# Patient Record
Sex: Female | Born: 1990 | Race: White | Hispanic: No | Smoking: Never smoker
Health system: Southern US, Community
[De-identification: ages and names within clinical notes are randomized; demographics above are authoritative.]

## PROBLEM LIST (undated history)

## (undated) DIAGNOSIS — F419 Anxiety disorder, unspecified: Secondary | ICD-10-CM

---

## 2018-04-13 ENCOUNTER — Emergency Department (HOSPITAL_BASED_OUTPATIENT_CLINIC_OR_DEPARTMENT_OTHER)
Admission: EM | Admit: 2018-04-13 | Discharge: 2018-04-13 | Disposition: A | Discharge status: Home / Self Care | Payer: Self-pay | Attending: Emergency Medicine | Admitting: Emergency Medicine

## 2018-04-13 ENCOUNTER — Emergency Department (HOSPITAL_BASED_OUTPATIENT_CLINIC_OR_DEPARTMENT_OTHER): Payer: Self-pay

## 2018-04-13 ENCOUNTER — Encounter (HOSPITAL_BASED_OUTPATIENT_CLINIC_OR_DEPARTMENT_OTHER): Payer: Self-pay

## 2018-04-13 ENCOUNTER — Other Ambulatory Visit: Payer: Self-pay

## 2018-04-13 DIAGNOSIS — R0789 Other chest pain: Secondary | ICD-10-CM | POA: Insufficient documentation

## 2018-04-13 DIAGNOSIS — Z79899 Other long term (current) drug therapy: Secondary | ICD-10-CM | POA: Insufficient documentation

## 2018-04-13 HISTORY — DX: Anxiety disorder, unspecified: F41.9

## 2018-04-13 LAB — PREGNANCY, URINE: Preg Test, Ur: NEGATIVE

## 2018-04-13 LAB — TROPONIN I: Troponin I: <0.03 ng/mL

## 2018-04-13 LAB — BASIC METABOLIC PANEL WITH GFR
Anion gap: 6 (ref 5–15)
BUN: 13 mg/dL (ref 6–20)
CO2: 24 mmol/L (ref 22–32)
Calcium: 8.9 mg/dL (ref 8.9–10.3)
Chloride: 106 mmol/L (ref 98–111)
Creatinine, Ser: 0.71 mg/dL (ref 0.44–1.00)
GFR calc Af Amer: >60 mL/min
GFR calc non Af Amer: >60 mL/min
Glucose, Bld: 107 mg/dL — ABNORMAL HIGH (ref 70–99)
Potassium: 3.5 mmol/L (ref 3.5–5.1)
Sodium: 136 mmol/L (ref 135–145)

## 2018-04-13 LAB — CBC
HCT: 40.4 % (ref 36.0–46.0)
Hemoglobin: 13.3 g/dL (ref 12.0–15.0)
MCH: 28.4 pg (ref 26.0–34.0)
MCHC: 32.9 g/dL (ref 30.0–36.0)
MCV: 86.1 fL (ref 80.0–100.0)
Platelets: 240 10*3/uL (ref 150–400)
RBC: 4.69 MIL/uL (ref 3.87–5.11)
RDW: 13.2 % (ref 11.5–15.5)
WBC: 9.2 10*3/uL (ref 4.0–10.5)
nRBC: 0 % (ref 0.0–0.2)

## 2018-04-13 LAB — D-DIMER, QUANTITATIVE: D-Dimer, Quant: <0.27 ug/mL-FEU (ref 0.00–0.50)

## 2018-04-13 MED ORDER — SODIUM CHLORIDE 0.9% FLUSH
3.0000 mL | Freq: Once | INTRAVENOUS | Status: DC
  Filled 2018-04-13: qty 3

## 2018-04-13 MED ORDER — HYDROXYZINE HCL 25 MG PO TABS: 25.0000 mg | ORAL_TABLET | Freq: Four times a day (QID) | ORAL | 0 refills | Status: AC | PRN

## 2018-04-13 NOTE — Discharge Instructions (Signed)
Your evaluated today for chest pain.  Your work-up was negative in the department.  I given you medicine to help with your anxiety.  Please take as prescribed.  Please follow-up with your PCP for reevaluation.  Have also given you the contact information for cardiology.  You will need to follow-up with them for reevaluation.  Their contact information is listed on your discharge paperwork.  Return to the ED for any new or worsening symptoms.

## 2018-04-13 NOTE — ED Provider Notes (Signed)
[facility] EMERGENCY DEPARTMENT Provider Note   CSN: [id number] Arrival date & time: 04/13/18  1933  History   Chief Complaint Chief Complaint  Patient presents with   Shortness of Breath    HPI Mary Peterson is a 28 y.o. female with no significant past medical history who presents for evaluation of shortness of breath chest pain.  Patient states she has had shortness of breath and chest pain x2 weeks.  Describes the pain as a dull aching sensation in her left upper chest.  Rates her pain a 7/10.  Pain does not radiate.  She states she will have an occasional sharp pain to her left chest.  Patient states she originally related this to increased anxiety.  Patient states she has had "a lot going on."  Patient states she recently was told her house was sold and she has to move, she lost possession of her car, her grandmother died approximately 1 week ago and was told today that her father is in the hospital.  Patient states she has struggled with anxiety "over the years."  Patient states she was previously on Ambien to sleep, however states "we have addiction in her family" and did not want to be on this long-term.  She does have Nexplanon in her left arm.  Chest pain is not exertional nature.  No associated lightheadedness, dizziness, diaphoresis, radiation of pain to jaw or left arm.  No hemoptysis, palpitations, lower extremity edema, erythema, ecchymosis or warmth.  Denies fever, chills, nausea, vomiting, neck pain, neck stiffness, range of motion in her extremities, abdominal pain, diarrhea, dysuria.  Patient states she has had significant reflux symptoms over the last month.  Denies PND or orthopnea.  No aggravating or alleviating factors.  Has not taken anything for symptoms.  Patient states she did call her PCP and they are not able to get her in until the beginning of April.  Patient states she does have social work follow-up on Tuesday to talk about her home and social situation.   She states her symptoms are similar to her previous panic attacks and anxiety disorder.  History obtained from patient and friend in room.  No interpreter was used.    HPI  Past Medical History:  Diagnosis Date   Anxiety     There are no active problems to display for this patient.   Past Surgical History:  Procedure Laterality Date   CESAREAN SECTION       OB History   No obstetric history on file.      Home Medications    Prior to Admission medications   Medication Sig Start Date End Date Taking? Authorizing Provider  etonogestrel (NEXPLANON) 68 MG IMPL implant 68 mg by Subdermal route.    [provider]  hydrOXYzine (ATARAX/VISTARIL) 25 MG tablet Take 1 tablet (25 mg total) by mouth every 6 (six) hours as needed for anxiety. 04/13/18   Guillermina Shaft A, PA-C    Family History No family history on file.  Social History Social History   Tobacco Use   Smoking status: Never Smoker   Smokeless tobacco: Never Used  Substance Use Topics   Alcohol use: Yes    Comment: occ   Drug use: Never     Allergies   Amoxil [amoxicillin] and Penicillins   Review of Systems Review of Systems  Constitutional: Negative.   HENT: Negative.   Eyes: Negative.   Respiratory: Positive for shortness of breath. Negative for apnea, cough, choking, chest tightness, wheezing  and stridor.   Cardiovascular: Positive for chest pain. Negative for palpitations and leg swelling.  Gastrointestinal: Negative.   Genitourinary: Negative.   Musculoskeletal: Negative.   Skin: Negative.   Neurological: Negative.   All other systems reviewed and are negative.    Physical Exam Updated Vital Signs BP 132/87 (BP Location: Right Arm)    Pulse 70    Temp 98.4 F (36.9 C)    Resp 16    SpO2 100%   Physical Exam Vitals signs and nursing note reviewed.  Constitutional:      General: She is not in acute distress.    Appearance: She is well-developed. She is not  ill-appearing, toxic-appearing or diaphoretic.  HENT:     Head: Normocephalic and atraumatic.     Mouth/Throat:     Comments: Poserior oropharynx clear.  Mucous membranes moist.  No tonsillar edema or exudate  Uvula midline without deviation. Eyes:     Pupils: Pupils are equal, round, and reactive to light.  Neck:     Musculoskeletal: Normal range of motion.     Comments: No neck stiffness or neck rigidity.  No meningismus.  No carotid bruits.  No cervical lymphadenopathy.  No JVD. Cardiovascular:     Rate and Rhythm: Normal rate.     Pulses: Normal pulses.     Heart sounds: Normal heart sounds. No murmur. No friction rub. No gallop.      Comments: No murmurs, rubs or gallops. Pulmonary:     Effort: No respiratory distress.     Comments: Clear to auscultation bilaterally without wheeze, rhonchi or rales.  No accessory muscle usage.  Able to speak in full sentences without difficulty. Abdominal:     General: There is no distension.     Comments: Soft, nontender without rebound or guarding.  Musculoskeletal: Normal range of motion.     Comments: Moves all 4 extremities without difficulty.  Ambulatory in department by difficulty no lower extremity tenderness, erythema, swelling or warmth.  Skin:    General: Skin is warm and dry.     Comments: No rashes or lesions.  Brisk capillary refill.  Neurological:     Mental Status: She is alert.     Comments: No Facial droop.  Cranial nerves II through XII intact.  Full and equal strength to upper and lower extremities without difficulty.  Psychiatric:     Comments: Patient is very anxious.  Denies SI, HI, AVH.      ED Treatments / Results  Labs (all labs ordered are listed, but only abnormal results are displayed) Labs Reviewed  BASIC METABOLIC PANEL - Abnormal; Notable for the following components:      Result Value   Glucose, Bld 107 (*)    All other components within normal limits  CBC  TROPONIN I  PREGNANCY, URINE  D-DIMER,  QUANTITATIVE (NOT AT [facility])    EKG EKG Interpretation  Date/Time:  Thursday April 13 2018 19:42:25 EDT Ventricular Rate:  99 PR Interval:  142 QRS Duration: 88 QT Interval:  334 QTC Calculation: 428 R Axis:   93 Text Interpretation:  Normal sinus rhythm with sinus arrhythmia Rightward axis Borderline ECG agree. no old Confirmed by Arby Barrette [phone]) on 04/13/2018 9:27:44 PM   Radiology Dg Chest 2 View  Result Date: 04/13/2018 CLINICAL DATA:  27 y/o female with c/o sternal to slight LEFT CP that radiates to neck/shoulder/arm x3 days. Also c/o SOB. Hx anxiety with recent life-changing situations. Non-smoker. EXAM: CHEST - 2 VIEW COMPARISON:  None. FINDINGS: The heart size and mediastinal contours are within normal limits. Both lungs are clear. No pleural effusion or pneumothorax. The visualized skeletal structures are unremarkable. IMPRESSION: Normal chest radiographs. Electronically Signed   By: Amie Portland M.D.   On: 04/13/2018 20:10    Procedures Procedures (including critical care time)  Medications Ordered in ED Medications  sodium chloride flush (NS) 0.9 % injection 3 mL (3 mLs Intravenous Not Given 04/13/18 2119)     Initial Impression / Assessment and Plan / ED Course  I have reviewed the triage vital signs and the nursing notes.  Pertinent labs & imaging results that were available during my care of the patient were reviewed by me and considered in my medical decision making (see chart for details).  29 year old female appears otherwise well presents for evaluation of chest pain and shortness of breath.  Symptom onset x2 weeks.  Nonexertional in nature.  No risk factors for DVT and PE.  Lower extremities without edema, erythema or warmth.  No cough or hemoptysis.  No oral OCPs, recent travel recent mobilization.  Heart score 0-negative, Wells criteria low risk.  Metabolic panel without electrolyte, renal or liver abnormality, CBC without leukocytosis, pregnancy test  negative, d-dimer negative, troponin negative, given patient has had symptoms x2 weeks do not feel patient needs additional troponin at this time, chest x-ray without infiltrates, cardiomegaly, pulmonary edema, pneumothorax, EKG without ST/T changes.  No evidence of diffuse ST elevation to indicate pericarditis, pain is not positional in nature.  Low suspicion for ACS, PE, dissection, pericarditis.  Does seem extremely anxious on exam.  She does not want anything for anxiety at this time.  Discussed follow-up with PCP for reevaluation.  Patient able to ambulate in department with oxygen saturation greater than 95%.  She has no tachypnea, tachycardia or hypoxia during her exam.  Patient is to be discharged with recommendation to follow up with PCP in regards to today's hospital visit. Chest pain is not likely of cardiac or pulmonary etiology d/t presentation, PERC negative, VSS, no tracheal deviation, no JVD or new murmur, RRR, breath sounds equal bilaterally, EKG without acute abnormalities, negative troponin, and negative CXR. Pt has been advised to return to the ED if CP becomes exertional, associated with diaphoresis or nausea, radiates to left jaw/arm, worsens or becomes concerning in any way. Pt appears reliable for follow up and is agreeable to discharge.      Final Clinical Impressions(s) / ED Diagnoses   Final diagnoses:  Atypical chest pain    ED Discharge Orders         Ordered    hydrOXYzine (ATARAX/VISTARIL) 25 MG tablet  Every 6 hours PRN     04/13/18 2238           Milea Klink A, PA-C 04/13/18 2248    Arby Barrette, MD 04/15/18 1243

## 2018-04-13 NOTE — ED Triage Notes (Signed)
C/o SOB, CP x 2 weeks-NAD-steady gait

## 2021-03-12 ENCOUNTER — Emergency Department (HOSPITAL_BASED_OUTPATIENT_CLINIC_OR_DEPARTMENT_OTHER): Payer: Self-pay

## 2021-03-12 ENCOUNTER — Other Ambulatory Visit: Payer: Self-pay

## 2021-03-12 ENCOUNTER — Encounter (HOSPITAL_BASED_OUTPATIENT_CLINIC_OR_DEPARTMENT_OTHER): Payer: Self-pay

## 2021-03-12 ENCOUNTER — Emergency Department (HOSPITAL_BASED_OUTPATIENT_CLINIC_OR_DEPARTMENT_OTHER)
Admission: EM | Admit: 2021-03-12 | Discharge: 2021-03-12 | Disposition: A | Discharge status: Home / Self Care | Payer: Self-pay | Attending: Emergency Medicine | Admitting: Emergency Medicine

## 2021-03-12 DIAGNOSIS — M79605 Pain in left leg: Secondary | ICD-10-CM | POA: Insufficient documentation

## 2021-03-12 DIAGNOSIS — M5432 Sciatica, left side: Secondary | ICD-10-CM

## 2021-03-12 DIAGNOSIS — M25562 Pain in left knee: Secondary | ICD-10-CM | POA: Insufficient documentation

## 2021-03-12 DIAGNOSIS — M5442 Lumbago with sciatica, left side: Secondary | ICD-10-CM | POA: Insufficient documentation

## 2021-03-12 DIAGNOSIS — G8929 Other chronic pain: Secondary | ICD-10-CM | POA: Insufficient documentation

## 2021-03-12 DIAGNOSIS — S8012XA Contusion of left lower leg, initial encounter: Secondary | ICD-10-CM | POA: Insufficient documentation

## 2021-03-12 DIAGNOSIS — X58XXXA Exposure to other specified factors, initial encounter: Secondary | ICD-10-CM | POA: Insufficient documentation

## 2021-03-12 DIAGNOSIS — S8011XA Contusion of right lower leg, initial encounter: Secondary | ICD-10-CM | POA: Insufficient documentation

## 2021-03-12 LAB — CBC WITH DIFFERENTIAL/PLATELET
Abs Immature Granulocytes: 0.02 10*3/uL (ref 0.00–0.07)
Basophils Absolute: 0 10*3/uL (ref 0.0–0.1)
Basophils Relative: 0 %
Eosinophils Absolute: 0.1 10*3/uL (ref 0.0–0.5)
Eosinophils Relative: 1 %
HCT: 39.1 % (ref 36.0–46.0)
Hemoglobin: 13.2 g/dL (ref 12.0–15.0)
Immature Granulocytes: 0 %
Lymphocytes Relative: 36 %
Lymphs Abs: 2.7 10*3/uL (ref 0.7–4.0)
MCH: 29.5 pg (ref 26.0–34.0)
MCHC: 33.8 g/dL (ref 30.0–36.0)
MCV: 87.5 fL (ref 80.0–100.0)
Monocytes Absolute: 0.5 10*3/uL (ref 0.1–1.0)
Monocytes Relative: 7 %
Neutro Abs: 4.1 10*3/uL (ref 1.7–7.7)
Neutrophils Relative %: 56 %
Platelets: 225 10*3/uL (ref 150–400)
RBC: 4.47 MIL/uL (ref 3.87–5.11)
RDW: 13.2 % (ref 11.5–15.5)
WBC: 7.4 10*3/uL (ref 4.0–10.5)
nRBC: 0 % (ref 0.0–0.2)

## 2021-03-12 LAB — PREGNANCY, URINE: Preg Test, Ur: NEGATIVE

## 2021-03-12 MED ORDER — LIDOCAINE 4 % EX PTCH: 1.0000 | MEDICATED_PATCH | Freq: Every day | CUTANEOUS | 0 refills | Status: AC | PRN

## 2021-03-12 MED ORDER — MELOXICAM 7.5 MG PO TABS: 7.5000 mg | ORAL_TABLET | Freq: Every day | ORAL | 0 refills | Status: AC

## 2021-03-12 MED ORDER — METHOCARBAMOL 500 MG PO TABS
500.0000 mg | ORAL_TABLET | Freq: Two times a day (BID) | ORAL | 0 refills | Status: AC
Start: 2021-03-12 — End: ?

## 2021-03-12 NOTE — Discharge Instructions (Addendum)
You were seen here today for evaluation of your chronic left knee pain and sciatic pain.  Your labs and imaging were normal.  There was no sign of any blood clot, infection, fracture, or break.  I have prescribed you lidocaine patches, meloxicam, and a muscle relaxer.  Your pregnancy test was negative here, but do not take these medications if there is any chance of pregnancy.  Please apply the lidocaine patches to your left buttock as shown as needed.  You can take the meloxicam daily to help with pain.  This may take a few days to build up in your system to provide you some relief.  Do not use ibuprofen in conjunction with this medication.  You can use a muscle relaxer as needed.  It may make you drowsy, so please do not operate heavy machinery or drive while this medication.  If you have any worsening pain, color changes, weakness, numbness, tingling to the area, urinary fecal incontinence, or urinary retention, please return to the nearest emergency department for reevaluation.  Please follow-up with [facility] for a reevaluation and follow-up since your knee pains been going on for over 10 years, he will likely need an MRI and orthopedic follow-up.  I have included an orthopedic follow-up in this discharge paperwork.  You will need to call to schedule an appointment.

## 2021-03-12 NOTE — ED Notes (Signed)
Ultrasound at bedside, unable to obtain vitals at this time.

## 2021-03-12 NOTE — ED Triage Notes (Signed)
Pt states that she drives 12 hours every weekend

## 2021-03-12 NOTE — ED Triage Notes (Signed)
Pt to er, pt states that she is here for L knee pain, states that she has always had some knee pain, states that over the past week the pain has gotten worse and now radiates into her leg.

## 2021-03-12 NOTE — ED Provider Notes (Signed)
[facility] EMERGENCY DEPARTMENT Provider Note   CSN: [id number] Arrival date & time: 03/12/21  1009     History Chief Complaint  Patient presents with   Knee Pain    Mary Peterson is a 31 y.o. female with history of chronic knee pain presents the emergency department for evaluation of acute on chronic left knee pain.  She reports that it has been hurting now for a week and radiates to the back of her left knee.  She reports that the pain is mainly on the medial aspect.  She is also planing of some left-sided sciatic back pain as well. She reports that she regularly drives 12 hours every weekend.  She is currently on OCPs.  Denies any history of any DVT or PE.  She denies any numbness, tingling, or weakness to the leg.  She denies any swelling or any color changes.  She denies any chest pain or shortness of breath.  She denies any urinary retention, urinary incontinence, or fecal incontinence.  She reports she has had this pain for over 10 years but is not been to an orthopedic for it.  Additionally, the patient mentions that she has been suffering from easily bruising for "years and years".  She denies any new trauma, falls, or any inciting injury.   Knee Pain Associated symptoms: no back pain and no fever       Home Medications Prior to Admission medications   Medication Sig Start Date End Date Taking? Authorizing Provider  etonogestrel (NEXPLANON) 68 MG IMPL implant 68 mg by Subdermal route.    [provider]  hydrOXYzine (ATARAX/VISTARIL) 25 MG tablet Take 1 tablet (25 mg total) by mouth every 6 (six) hours as needed for anxiety. 04/13/18   Henderly, Britni A, PA-C      Allergies    Amoxil [amoxicillin] and Penicillins    Review of Systems   Review of Systems  Constitutional:  Negative for chills and fever.  Respiratory:  Negative for shortness of breath.   Cardiovascular:  Negative for chest pain and leg swelling.  Genitourinary:  Negative for decreased  urine volume, difficulty urinating and enuresis.  Musculoskeletal:  Positive for arthralgias. Negative for back pain and joint swelling.  Skin:  Negative for color change.  Neurological:  Negative for weakness and numbness.   Physical Exam Updated Vital Signs BP 131/72 (BP Location: Right Arm)    Pulse 70    Temp 98.1 F (36.7 C) (Oral)    Resp 16    Ht 5\' 3"  (1.6 m)    Wt 81.6 kg    SpO2 100%    BMI 31.89 kg/m  Physical Exam Vitals and nursing note reviewed.  Constitutional:      Appearance: Normal appearance.  Eyes:     General: No scleral icterus. Pulmonary:     Effort: Pulmonary effort is normal. No respiratory distress.  Musculoskeletal:        General: Tenderness present. No swelling or deformity. Normal range of motion.     Right lower leg: No edema.     Left lower leg: No edema.     Comments: No swelling or color change noted to the left leg or knee.  No rash, erythema,abrasion, or laceration noted.  Small bruises noted to bilateral shins.  She has palpable DP and PT pulses.  Compartments are soft.  Sensations equal bilaterally.  She has some tenderness to the medial aspect of the left knee and into the popliteal fossa.  No  palpable mass or cyst here.  There is some crepitus over the left knee.  The patient has full range of motion with pain.  Normal gait.  Tenderness over the left buttock overlying the SI joint.  No midline or paraspinal cervical, thoracic, or lumbar tenderness.  No overlying skin changes noted.  No palpable deformities or step-offs noted.  Skin:    General: Skin is dry.     Findings: No rash.  Neurological:     General: No focal deficit present.     Mental Status: She is alert. Mental status is at baseline.     Sensory: No sensory deficit.     Motor: No weakness.     Gait: Gait normal.  Psychiatric:        Mood and Affect: Mood normal.    ED Results / Procedures / Treatments   Labs (all labs ordered are listed, but only abnormal results are  displayed) Labs Reviewed  PREGNANCY, URINE    EKG None  Radiology US Venous Img Lower Unilateral Left  Result Date: 03/12/2021 CLINICAL DATA:  Left leg pain after a long trip. EXAM: LEFT LOWER EXTREMITY VENOUS DOPPLER ULTRASOUND TECHNIQUE: Gray-scale sonography with compression, as well as color and duplex ultrasound, were performed to evaluate the deep venous system(s) from the level of the common femoral vein through the popliteal and proximal calf veins. COMPARISON:  None. FINDINGS: VENOUS Normal compressibility of the common femoral, superficial femoral, and popliteal veins, as well as the visualized calf veins. Visualized portions of profunda femoral vein and great saphenous vein unremarkable. No filling defects to suggest DVT on grayscale or color Doppler imaging. Doppler waveforms show normal direction of venous flow, normal respiratory plasticity and response to augmentation. Limited views of the contralateral common femoral vein are unremarkable. OTHER None. Limitations: none IMPRESSION: No left deep venous thrombosis is seen. Electronically Signed   By: Yvonne Kendall M.D.   On: 03/12/2021 13:15   DG Knee Complete 4 Views Left  Result Date: 03/12/2021 CLINICAL DATA:  Posterior knee pain EXAM: LEFT KNEE - COMPLETE 4+ VIEW COMPARISON:  None. FINDINGS: No evidence of fracture, dislocation, or joint effusion. No evidence of arthropathy or other focal bone abnormality. Soft tissues are unremarkable. IMPRESSION: Negative. Electronically Signed   By: Dahlia Bailiff M.D.   On: 03/12/2021 13:00    Procedures Procedures   Medications Ordered in ED Medications - No data to display  ED Course/ Medical Decision Making/ A&P                           Medical Decision Making Amount and/or Complexity of Data Reviewed Labs: ordered. Radiology: ordered. ECG/medicine tests: ordered.  Risk OTC drugs. Prescription drug management.   31 year old female presents emerged department for evaluation  of acute on chronic left knee pain for the past week.  Differential diagnosis includes but is not limited to sciatica, chronic pain, arthritis, tendon/ligament injury, DVT, cellulitis.  Vital signs are stable.  Normotensive, afebrile, normal pulse rate, satting well on room air.  Physical exam does show some crepitus to the knee however she has full range of motion with some pain and a normal gait.  Palpable DP and PT pulses bilaterally.  Compartments are soft.  Sensations intact.  No overlying skin changes noted.  No swelling noted.  Given her long car rides in addition to her OCP use, cannot rule her out with PERC.  Will order ultrasound for the leg to rule  out any DVT in addition to an x-ray.  At this time, low suspicion for any cellulitis, cauda equina, or septic arthritis as there is no swelling or erythema noted to the area.  The patient does not have any weakness, numbness, tingling, incontinence, or retention symptoms.  Less likely cauda equina.    I independently reviewed and interpreted the patient's labs and imaging and I agree with radiologist findings. CBC unremarkable.  No signs of leukocytosis or anemia.  Normal platelet count.  Pregnancy test negative.  Ultrasound does not show any signs of the left leg DVT.  X-ray is normal does not show any signs of arthritis or air producing infectious organism.  Given the reassuring labs and imaging, the patient will be safe for discharge.  We will prescribe her lidocaine patches, meloxicam, and Robaxin for her sciatica and acute on chronic knee pain.  I discussed with her given the duration of her knee pain and no prior evaluation by orthopedic or imaging before, she will likely need to see an orthopedic doctor for further evaluation on this pain.  She sees the [facility] and reported that she would get a referral from them, although I went ahead and attached an orthopedic referral to her to report just in case.  Strict return  precautions given.  Patient agrees to plan.  Patient is stable and being discharged home in good condition.   Final Clinical Impression(s) / ED Diagnoses Final diagnoses:  Chronic pain of left knee  Sciatica of left side    Rx / DC Orders ED Discharge Orders          Ordered    Lidocaine (HM LIDOCAINE PATCH) 4 % PTCH  Daily PRN        03/12/21 1508    meloxicam (MOBIC) 7.5 MG tablet  Daily        03/12/21 1508    methocarbamol (ROBAXIN) 500 MG tablet  2 times daily        03/12/21 1508              Sherrell Puller, PA-C 03/15/21 2327    Fredia Sorrow, MD 03/19/21 [phone]

## 2021-04-23 ENCOUNTER — Other Ambulatory Visit: Payer: Self-pay

## 2021-04-23 ENCOUNTER — Emergency Department (HOSPITAL_BASED_OUTPATIENT_CLINIC_OR_DEPARTMENT_OTHER)
Admission: EM | Admit: 2021-04-23 | Discharge: 2021-04-23 | Disposition: A | Discharge status: Home / Self Care | Payer: Medicaid Other | Attending: Emergency Medicine | Admitting: Emergency Medicine

## 2021-04-23 ENCOUNTER — Encounter (HOSPITAL_BASED_OUTPATIENT_CLINIC_OR_DEPARTMENT_OTHER): Payer: Self-pay | Admitting: Urology

## 2021-04-23 DIAGNOSIS — G43909 Migraine, unspecified, not intractable, without status migrainosus: Secondary | ICD-10-CM | POA: Insufficient documentation

## 2021-04-23 DIAGNOSIS — G43009 Migraine without aura, not intractable, without status migrainosus: Secondary | ICD-10-CM

## 2021-04-23 MED ORDER — PROCHLORPERAZINE EDISYLATE 10 MG/2ML IJ SOLN
10.0000 mg | Freq: Once | INTRAMUSCULAR | Status: AC
  Administered 2021-04-23: 10 mg via INTRAVENOUS
  Filled 2021-04-23: qty 2

## 2021-04-23 MED ORDER — SODIUM CHLORIDE 0.9 % IV BOLUS
500.0000 mL | Freq: Once | INTRAVENOUS | Status: AC
  Administered 2021-04-23: 500 mL via INTRAVENOUS

## 2021-04-23 MED ORDER — KETOROLAC TROMETHAMINE 30 MG/ML IJ SOLN
30.0000 mg | Freq: Once | INTRAMUSCULAR | Status: AC
  Administered 2021-04-23: 30 mg via INTRAVENOUS
  Filled 2021-04-23: qty 1

## 2021-04-23 MED ORDER — DIPHENHYDRAMINE HCL 50 MG/ML IJ SOLN
12.5000 mg | Freq: Once | INTRAMUSCULAR | Status: AC
  Administered 2021-04-23: 12.5 mg via INTRAVENOUS
  Filled 2021-04-23: qty 1

## 2021-04-23 NOTE — ED Triage Notes (Signed)
Pt states migraine since 0600 this am  ?Took ibuprofen with no relief  ?States pressure in ears and behind eyes ?States sensitivity to light and sound ? ?

## 2021-04-23 NOTE — Discharge Instructions (Signed)
You were seen in the emergency department for headache.  Your symptoms improved with a migraine cocktail.  Please rest and stay well-hydrated.  Return to the emergency department if any worsening or concerning symptoms ?

## 2021-04-23 NOTE — ED Notes (Signed)
Patient discharged to home.  All discharge instructions reviewed.  Patient verbalized understanding via teachback method.  VS WDL.  Respirations even and unlabored.  Ambulatory out of ED.   °

## 2021-04-23 NOTE — ED Provider Notes (Signed)
?[facility] EMERGENCY DEPARTMENT ?Provider Note ? ? ?CSN: [id number] ?Arrival date & time: 04/23/21  1856 ? ?  ? ?History ? ?Chief Complaint  ?Patient presents with  ? Migraine  ? ? ?Mary Peterson is a 31 y.o. female.  She is complaining of a headache that started about 6 AM this morning it woke her up.  She said it across her whole head.  Associated with photophobia phonophobia.  Nausea no vomiting.  Has a history of migraines and is typical of her migraine.  Did not respond to ibuprofen and Excedrin.  Last menstrual period was 2 weeks ago.  No head injuries.  No fevers or chills.  No blurry vision double vision.  No numbness or weakness. ? ?The history is provided by the patient.  ?Migraine ?This is a recurrent problem. The current episode started 6 to 12 hours ago. The problem occurs constantly. The problem has not changed since onset.Associated symptoms include headaches. Pertinent negatives include no chest pain, no abdominal pain and no shortness of breath. Exacerbated by: Bright light and loud noises. Nothing relieves the symptoms. She has tried acetaminophen (Ibuprofen) for the symptoms. The treatment provided no relief.  ? ?  ? ?Home Medications ?Prior to Admission medications   ?Medication Sig Start Date End Date Taking? Authorizing Provider  ?etonogestrel (NEXPLANON) 68 MG IMPL implant 68 mg by Subdermal route.    [provider]  ?hydrOXYzine (ATARAX/VISTARIL) 25 MG tablet Take 1 tablet (25 mg total) by mouth every 6 (six) hours as needed for anxiety. 04/13/18   Henderly, Britni A, PA-C  ?Lidocaine (HM LIDOCAINE PATCH) 4 % PTCH Apply 1 patch topically daily as needed. 03/12/21   Achille Rich, PA-C  ?meloxicam (MOBIC) 7.5 MG tablet Take 1 tablet (7.5 mg total) by mouth daily. 03/12/21   Achille Rich, PA-C  ?methocarbamol (ROBAXIN) 500 MG tablet Take 1 tablet (500 mg total) by mouth 2 (two) times daily. 03/12/21   Achille Rich, PA-C  ?   ? ?Allergies    ?Amoxil [amoxicillin] and Penicillins    ? ?Review of Systems   ?Review of Systems  ?Constitutional:  Negative for fever.  ?HENT:  Negative for sore throat.   ?Eyes:  Positive for photophobia. Negative for visual disturbance.  ?Respiratory:  Negative for shortness of breath.   ?Cardiovascular:  Negative for chest pain.  ?Gastrointestinal:  Negative for abdominal pain.  ?Musculoskeletal:  Negative for neck stiffness.  ?Skin:  Negative for rash.  ?Neurological:  Positive for headaches.  ? ?Physical Exam ?Updated Vital Signs ?BP (!) 130/98 (BP Location: Right Arm)   Pulse 70   Temp 98.3 ?F (36.8 ?C) (Oral)   Resp 18   Ht 5\' 3"  (1.6 m)   Wt 81.6 kg   LMP 04/02/2021 (Exact Date)   SpO2 99%   BMI 31.87 kg/m?  ?Physical Exam ?Vitals and nursing note reviewed.  ?Constitutional:   ?   General: She is not in acute distress. ?   Appearance: Normal appearance. She is well-developed.  ?HENT:  ?   Head: Normocephalic and atraumatic.  ?   Right Ear: Tympanic membrane normal.  ?   Left Ear: Tympanic membrane normal.  ?Eyes:  ?   Extraocular Movements: Extraocular movements intact.  ?   Conjunctiva/sclera: Conjunctivae normal.  ?   Pupils: Pupils are equal, round, and reactive to light.  ?Cardiovascular:  ?   Rate and Rhythm: Normal rate and regular rhythm.  ?   Heart sounds: No murmur heard. ?Pulmonary:  ?  Effort: Pulmonary effort is normal. No respiratory distress.  ?   Breath sounds: Normal breath sounds.  ?Abdominal:  ?   Palpations: Abdomen is soft.  ?   Tenderness: There is no abdominal tenderness. There is no guarding or rebound.  ?Musculoskeletal:     ?   General: No swelling.  ?   Cervical back: Neck supple.  ?Skin: ?   General: Skin is warm and dry.  ?   Capillary Refill: Capillary refill takes less than 2 seconds.  ?Neurological:  ?   General: No focal deficit present.  ?   Mental Status: She is alert.  ?   Cranial Nerves: No cranial nerve deficit.  ?   Sensory: No sensory deficit.  ?   Motor: No weakness.  ? ? ?ED Results / Procedures / Treatments    ?Labs ?(all labs ordered are listed, but only abnormal results are displayed) ?Labs Reviewed - No data to display ? ?EKG ?None ? ?Radiology ?No results found. ? ?Procedures ?Procedures  ? ? ?Medications Ordered in ED ?Medications  ?prochlorperazine (COMPAZINE) injection 10 mg (has no administration in time range)  ?diphenhydrAMINE (BENADRYL) injection 12.5 mg (has no administration in time range)  ?ketorolac (TORADOL) 30 MG/ML injection 30 mg (has no administration in time range)  ?sodium chloride 0.9 % bolus 500 mL (has no administration in time range)  ? ? ?ED Course/ Medical Decision Making/ A&P ?Clinical Course as of 04/24/21 1115  ?Thu Apr 23, 2021  ?2021 Headache much improved after medication.  Patient feels comfortable with plan for discharge. [MB]  ?  ?Clinical Course User Index ?[MB] Terrilee Files, MD  ? ?                        ?Medical Decision Making ?Risk ?Prescription drug management. ? ?This patient complains of generalized headache/migraine; this involves an extensive number of treatment ?Options and is a complaint that carries with it a high risk of complications and ?morbidity. The differential includes headache, migraine, bleed, dehydration, hypertensive emergency ?I ordered medication IV fluids Compazine Benadryl Toradol with improvement in her symptoms and reviewed PMP when indicated. ?Previous records obtained and reviewed in epic no recent admissions ?Cardiac monitoring reviewed, sinus rhythm ?Social determinants considered, no significant barriers ? ?After the interventions stated above, I reevaluated the patient and found patient to be symptomatically improved. ?Admission and further testing considered, no indications for admission or further work-up at this time.  Patient comfortable plan for discharge and return instructions discussed. ? ? ? ? ? ? ? ? ? ?Final Clinical Impression(s) / ED Diagnoses ?Final diagnoses:  ?Migraine without aura and without status migrainosus, not  intractable  ? ? ?Rx / DC Orders ?ED Discharge Orders   ? ? None  ? ?  ? ? ?  ?Terrilee Files, MD ?04/24/21 1117 ? ?

## 2022-02-28 IMAGING — US US EXTREM LOW VENOUS*L*
1 series · 14 of 24 positions shown · non-contrast
Comparison: None.

CLINICAL DATA: Left leg pain after a long trip.

EXAM:
LEFT LOWER EXTREMITY VENOUS DOPPLER ULTRASOUND
TECHNIQUE: Gray-scale sonography with compression, as well as color and duplex
ultrasound, were performed to evaluate the deep venous system(s)
from the level of the common femoral vein through the popliteal and
proximal calf veins.

[Series 1: us extrem low venous*left* · 14 of 39 slices shown]
[im 1/39]
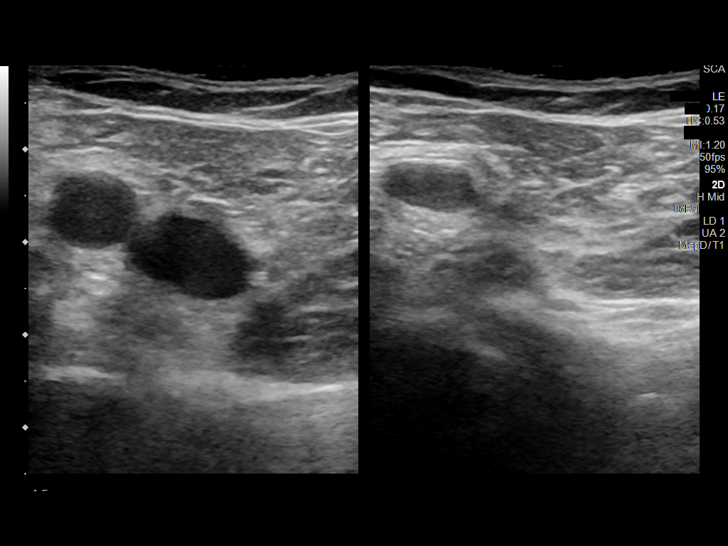
[im 4/39]
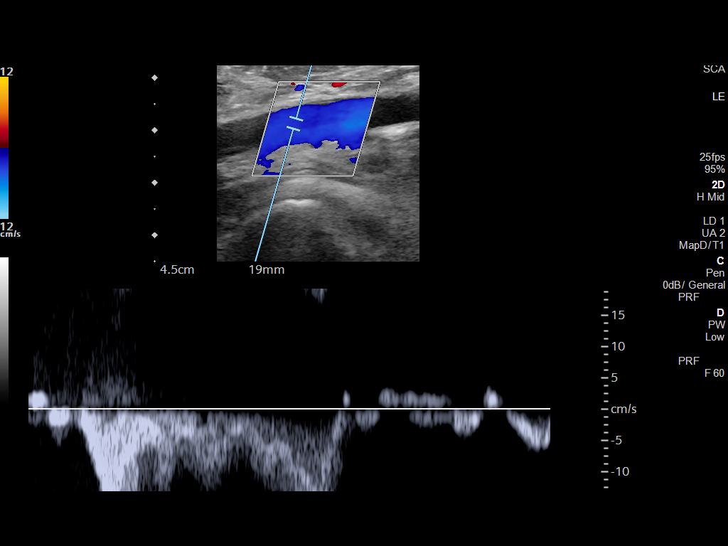
[im 7/39]
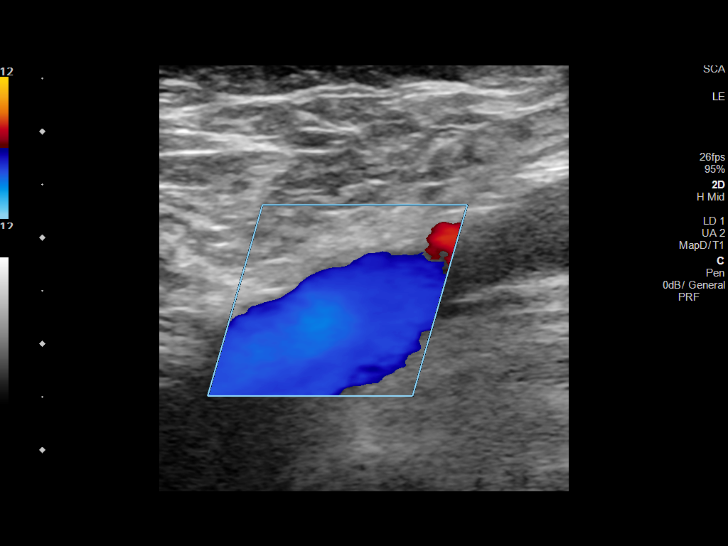
[im 10/39]
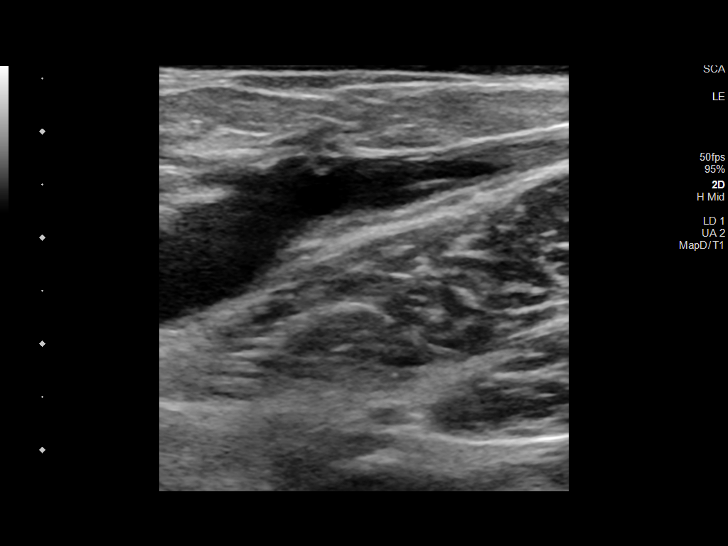
[im 12/39]
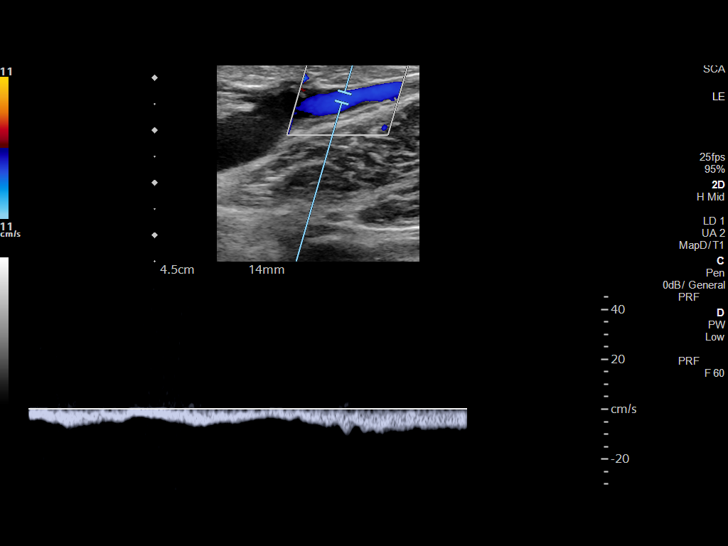
[im 15/39]
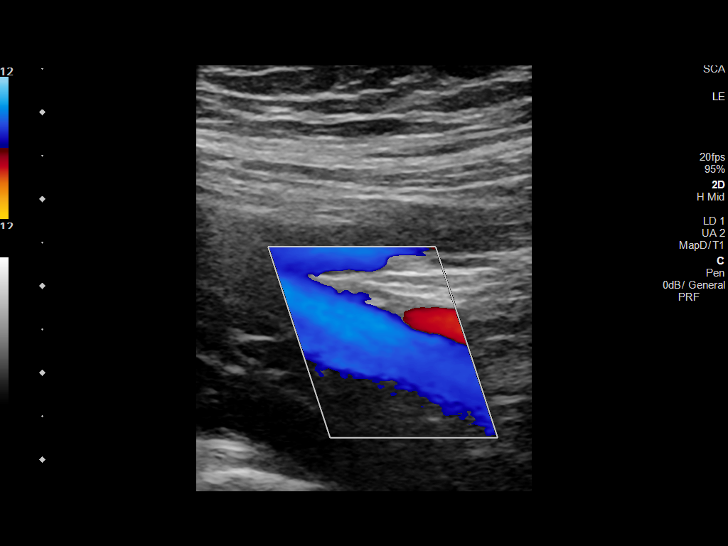
[im 19/39]
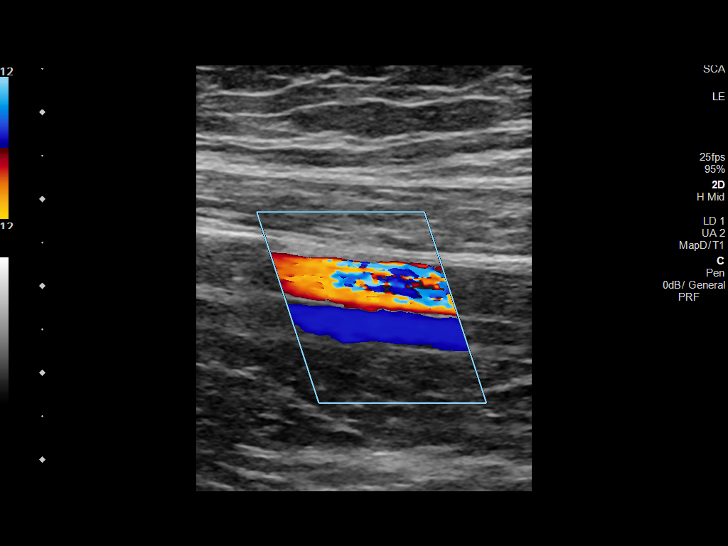
[im 20/39]
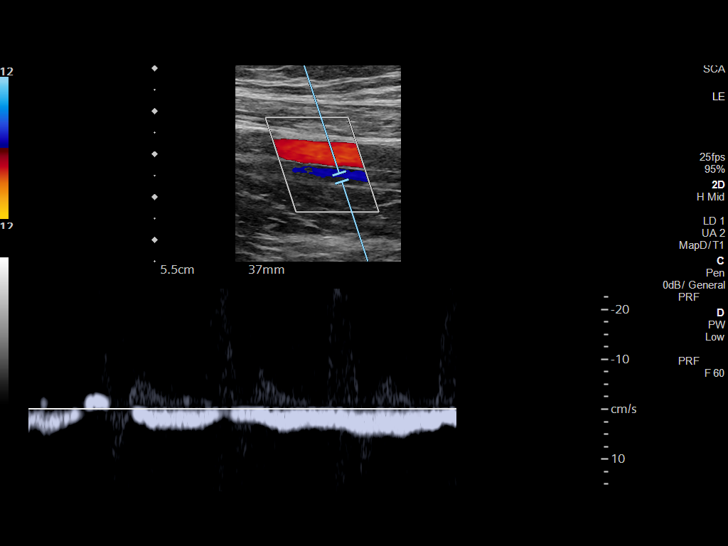
[im 24/39]
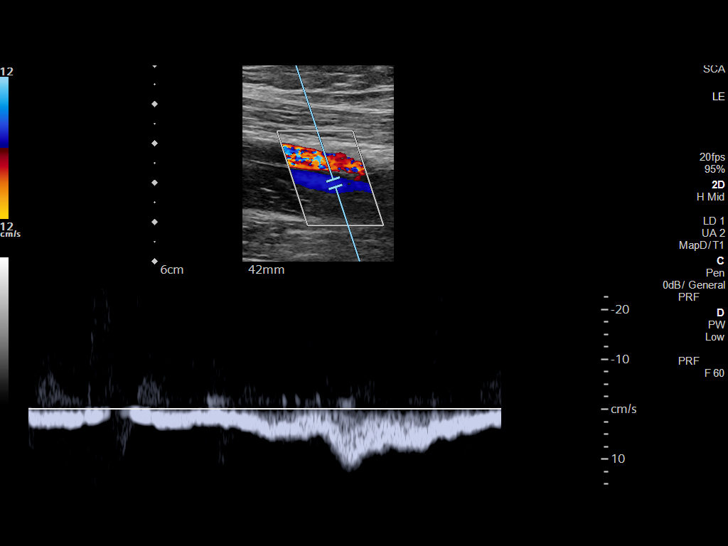
[im 27/39]
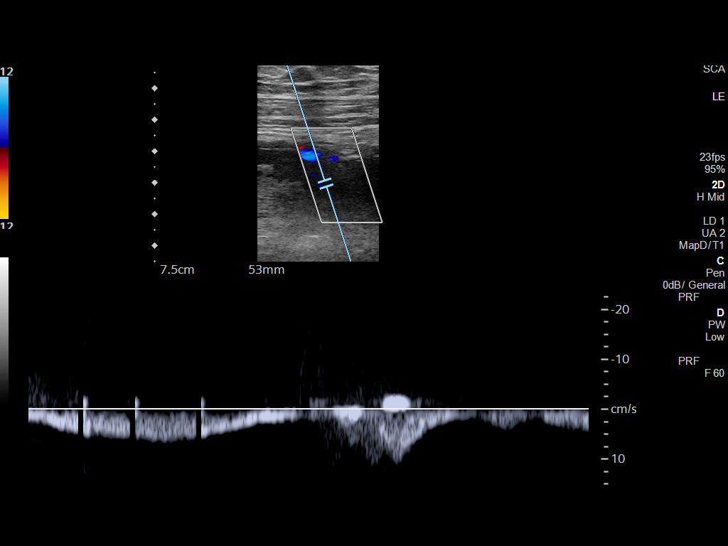
[im 30/39]
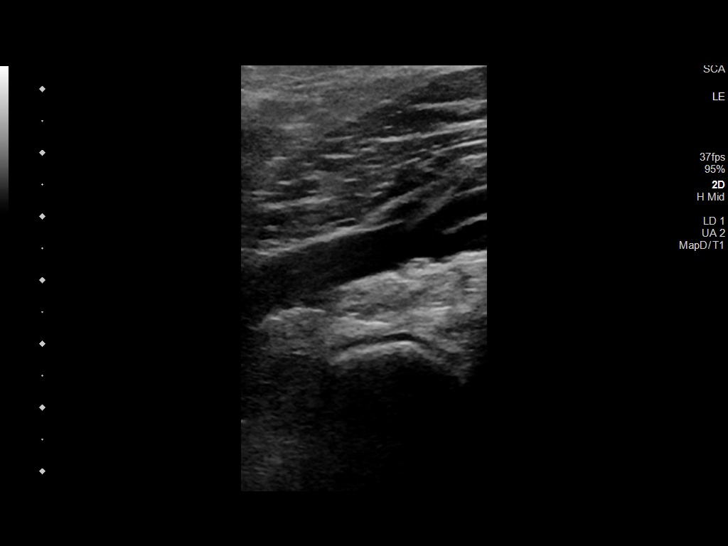
[im 32/39]
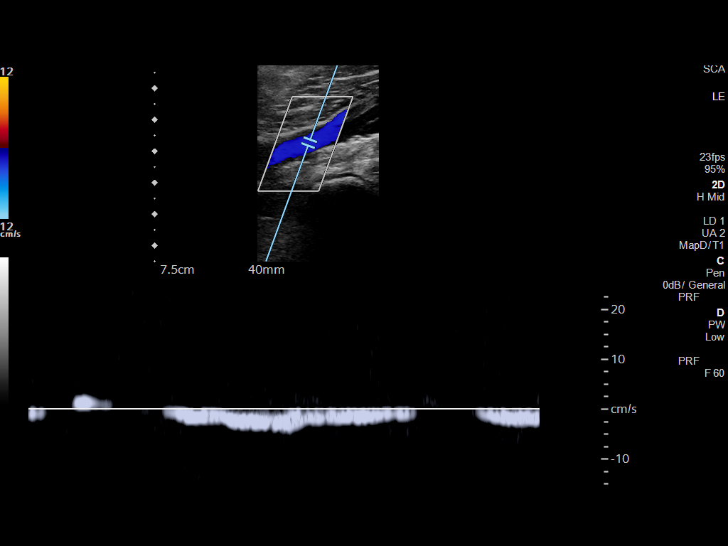
[im 35/39]
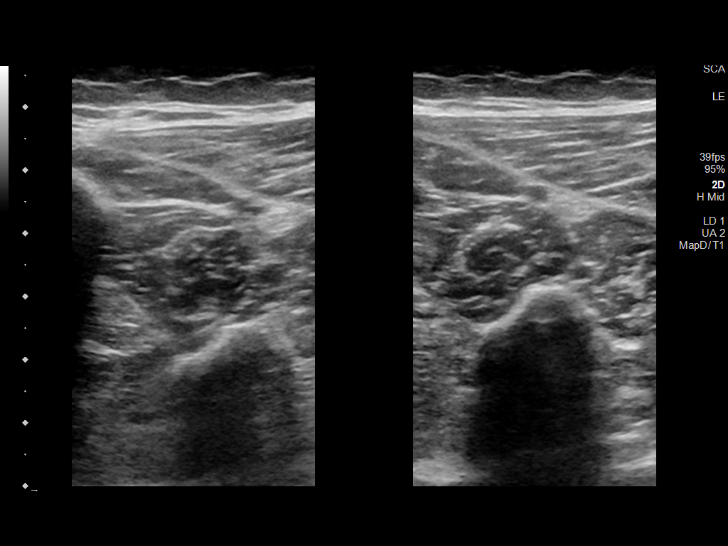
[im 39/39]
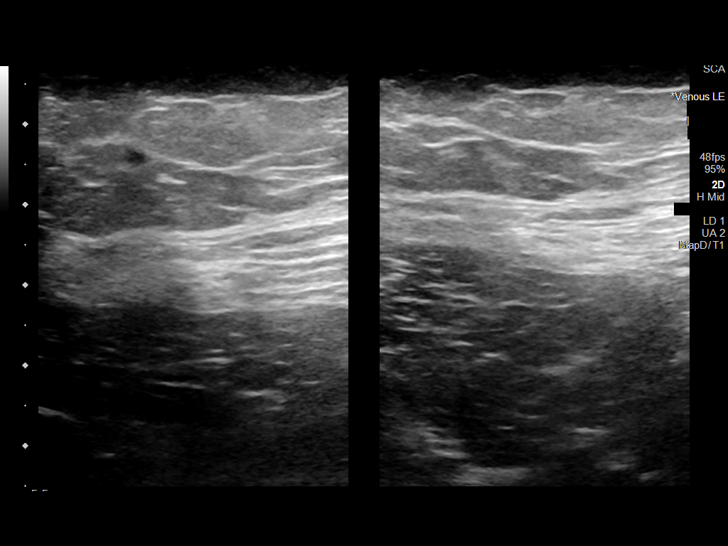

[14 of 24 positions shown; findings below may reference images not displayed]

FINDINGS: VENOUS

Normal compressibility of the common femoral, superficial femoral,
and popliteal veins, as well as the visualized calf veins.
Visualized portions of profunda femoral vein and great saphenous
vein unremarkable. No filling defects to suggest DVT on grayscale or
color Doppler imaging. Doppler waveforms show normal direction of
venous flow, normal respiratory plasticity and response to
augmentation.

Limited views of the contralateral common femoral vein are
unremarkable.

OTHER

None.

Limitations: none
IMPRESSION: No left deep venous thrombosis is seen.

## 2022-02-28 IMAGING — DX DG KNEE COMPLETE 4+V*L*
4 series · 4 of 4 positions shown · non-contrast
Comparison: None.

CLINICAL DATA: Posterior knee pain

EXAM:
LEFT KNEE - COMPLETE 4+ VIEW

[knee ap]
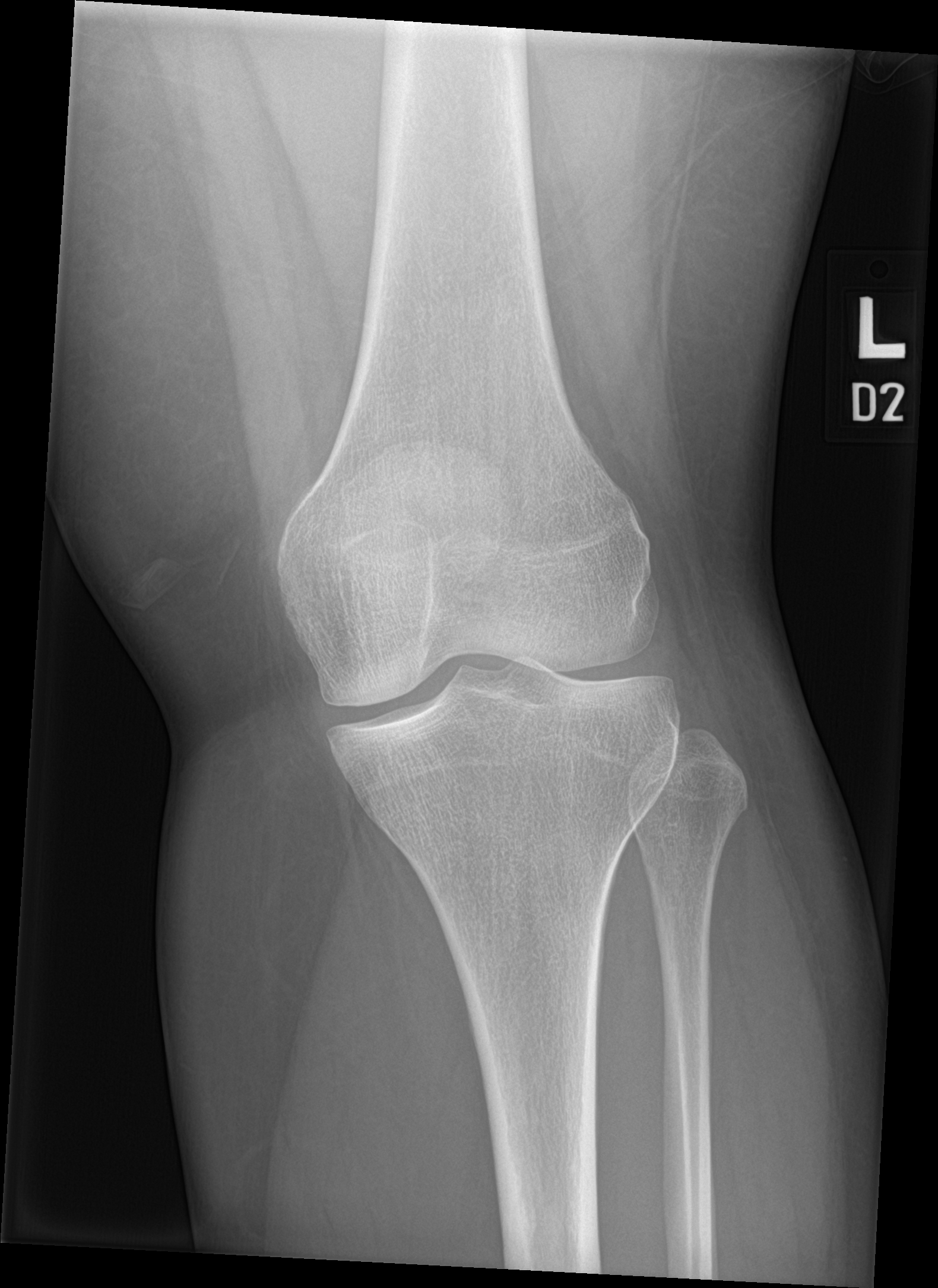

[knee lat]
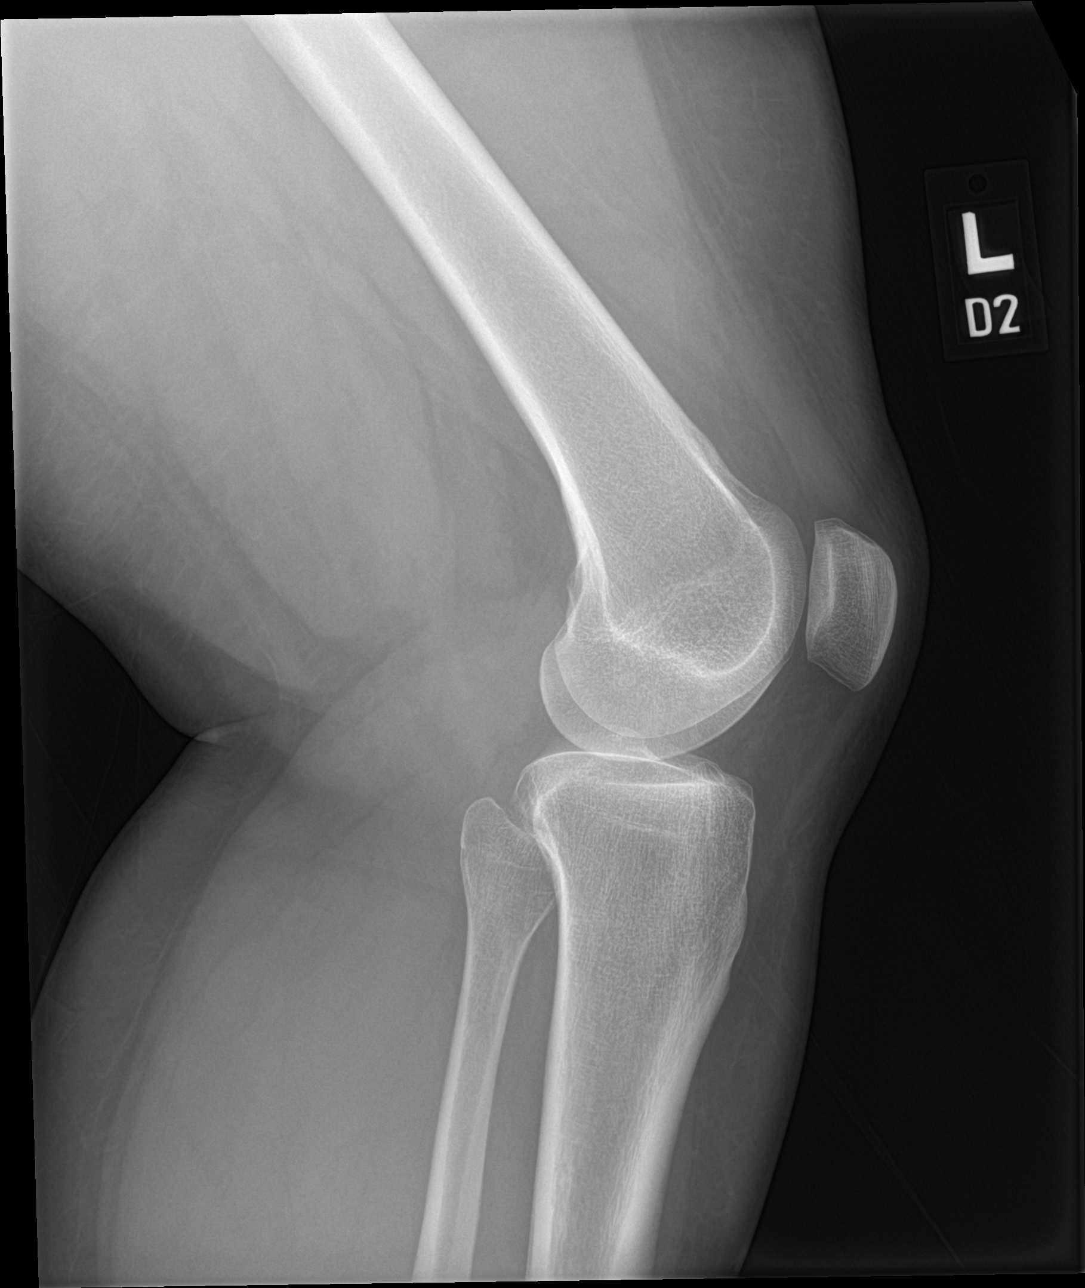

[knee obl (1 of 2)]
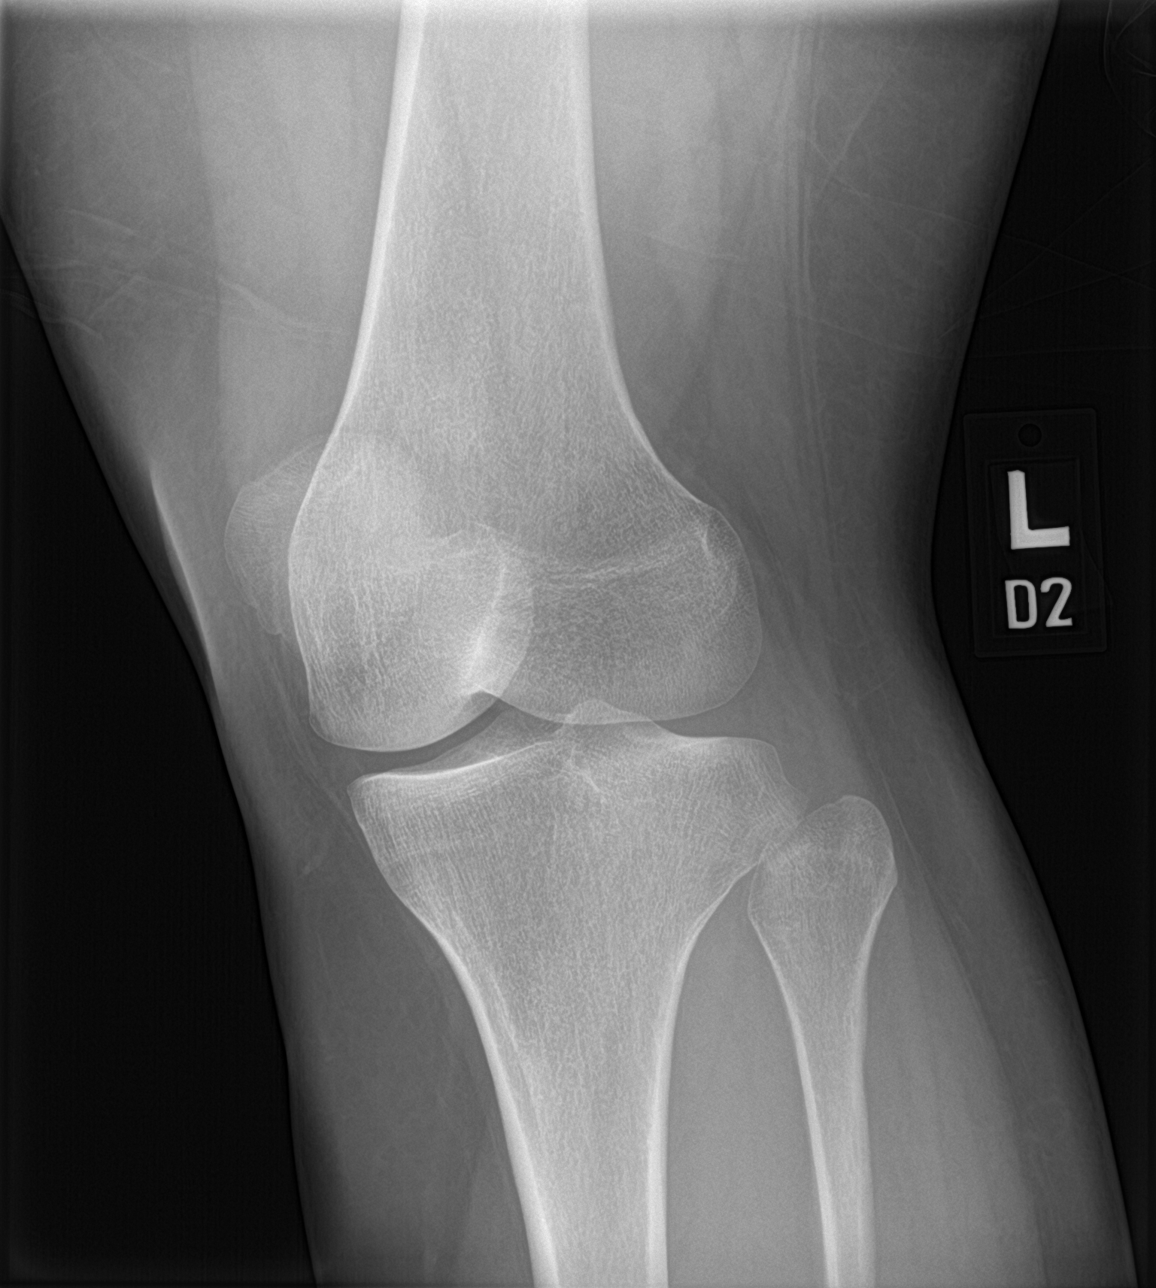

[knee obl (2 of 2)]
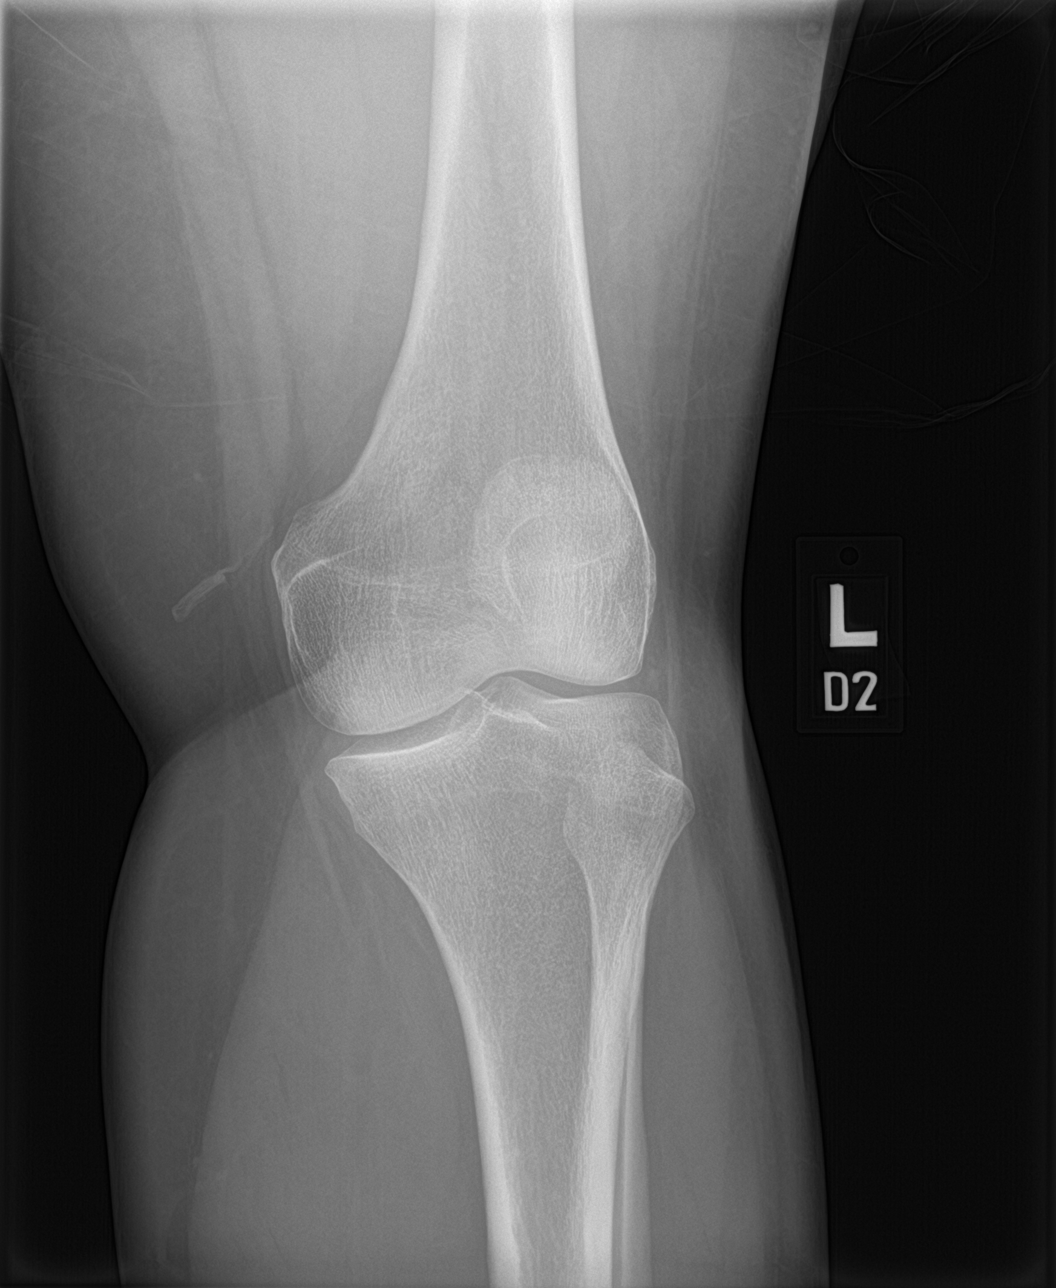

[4 of 4 positions shown; findings below may reference images not displayed]

FINDINGS: No evidence of fracture, dislocation, or joint effusion. No evidence
of arthropathy or other focal bone abnormality. Soft tissues are
unremarkable.
IMPRESSION: Negative.
